# Patient Record
Sex: Male | Born: 1975 | Race: White | Hispanic: No | Marital: Single | Smoking: Never smoker
Health system: Southern US, Community
[De-identification: ages and names within clinical notes are randomized; demographics above are authoritative.]

---

## 2007-12-17 ENCOUNTER — Emergency Department: Payer: Self-pay | Admitting: Emergency Medicine

## 2008-05-06 ENCOUNTER — Emergency Department: Payer: Self-pay | Admitting: Emergency Medicine

## 2009-11-18 ENCOUNTER — Emergency Department: Payer: Self-pay | Admitting: Emergency Medicine

## 2010-03-17 ENCOUNTER — Ambulatory Visit: Payer: Self-pay

## 2011-09-13 IMAGING — NM NM BONE LIMITED
2 series · 5 of 5 positions shown · non-contrast
Comparison: none

REASON FOR EXAM: ATTN LEFT WRIST  wrist pain  eval for fracture
COMMENTS:

[Series 1000: bone pinhole · 2.40mm/px · 1 of 1 slices shown]
[im 1/1  full-range]
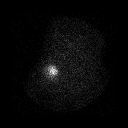

[Series 1000: bone statics · 2.40mm/px · 2 acquisitions, 4 frames shown]
[im 1/2]
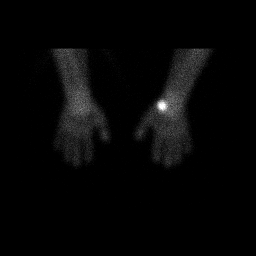
[im 1/2]
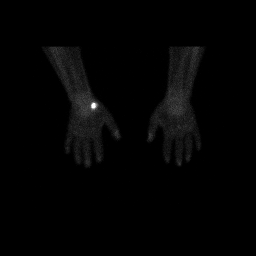
[im 2/2]
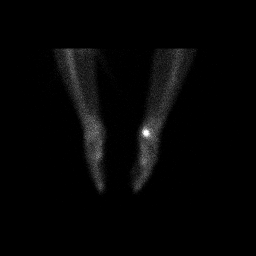
[im 2/2]
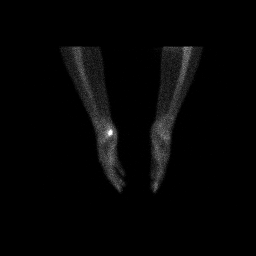

[5 of 5 positions shown; findings below may reference images not displayed]

PROCEDURE:     NM  - NM LIMITED BONE SCAN 3HR [DATE] [DATE]

RESULT:     Following intravenous administration of 22.7 mCi technetium-22m
MDP, bone scan of the wrists and hands was performed bilaterally. There is a
dense abnormal focal area of increased tracer activity in the region of the
left carpal navicular. If the patient has had recent trauma, fracture would
be the primary consideration as to etiology. The distribution of tracer
activity is otherwise normal in the hands and wrists.
IMPRESSION: Abnormal study secondary to the dense increase in tracer activity in the
region of the left navicular bone. Correlation with wrist radiographs is
recommended.

## 2015-05-22 ENCOUNTER — Encounter (HOSPITAL_COMMUNITY): Payer: Self-pay | Admitting: *Deleted

## 2015-05-22 ENCOUNTER — Emergency Department (HOSPITAL_COMMUNITY)
Admission: EM | Admit: 2015-05-22 | Discharge: 2015-05-22 | Disposition: A | Discharge status: Home / Self Care | Payer: BLUE CROSS/BLUE SHIELD | Source: Home / Self Care | Attending: Family Medicine | Admitting: Family Medicine

## 2015-05-22 DIAGNOSIS — H5712 Ocular pain, left eye: Secondary | ICD-10-CM | POA: Diagnosis not present

## 2015-05-22 MED ORDER — FLUORESCEIN SODIUM 1 MG OP STRP
ORAL_STRIP | OPHTHALMIC | Status: AC
  Filled 2015-05-22: qty 3

## 2015-05-22 NOTE — ED Notes (Signed)
Visual  Acuity  20  15  r  20/20 l   20/20 both

## 2015-05-22 NOTE — ED Provider Notes (Signed)
CSN: [id number]     Arrival date & time 05/22/15  1827 History   First MD Initiated Contact with Patient 05/22/15 1919     Chief Complaint  Patient presents with  . Eye Pain   (Consider location/radiation/quality/duration/timing/severity/associated sxs/prior Treatment) Patient is a 39 y.o. male presenting with eye pain. The history is provided by the patient.  Eye Pain This is a new problem. The current episode started 6 to 12 hours ago (awoke with soreness to left eye, no fb sens, no redness or drainage. no right eye sx, no contacts.). The problem has been gradually worsening.    History reviewed. No pertinent past medical history. History reviewed. No pertinent past surgical history. History reviewed. No pertinent family history. History  Substance Use Topics  . Smoking status: Never Smoker   . Smokeless tobacco: Not on file  . Alcohol Use: No    Review of Systems  Constitutional: Negative.   HENT: Negative.   Eyes: Positive for pain. Negative for photophobia, discharge, redness, itching and visual disturbance.    Allergies  Review of patient's allergies indicates no known allergies.  Home Medications   Prior to Admission medications   Not on File   BP 130/72 mmHg  Pulse 78  Temp(Src) 98.6 F (37 C) (Oral)  Resp 16  SpO2 100% Physical Exam  Constitutional: He is oriented to person, place, and time. He appears well-developed and well-nourished. No distress.  Eyes: Conjunctivae, EOM and lids are normal. Pupils are equal, round, and reactive to light. Lids are everted and swept, no foreign bodies found. Left eye exhibits no discharge, no exudate and no hordeolum. No foreign body present in the left eye. Left conjunctiva is not injected. Left conjunctiva has no hemorrhage. No scleral icterus.  Slit lamp exam:      The left eye shows no corneal abrasion, no corneal flare, no corneal ulcer, no foreign body, no hyphema, no fluorescein uptake and no anterior chamber bulge.   Neck: Normal range of motion. Neck supple.  Lymphadenopathy:    He has no cervical adenopathy.  Neurological: He is alert and oriented to person, place, and time.  Skin: Skin is warm and dry.  Nursing note and vitals reviewed.   ED Course  Procedures (including critical care time) Labs Review Labs Reviewed - No data to display  Imaging Review No results found.   MDM   1. Eye pain, left        Linna Hoff, MD 05/22/15 2005

## 2015-05-22 NOTE — Discharge Instructions (Signed)
Cool cloth soak to eye tonight and advil for soreness, call eye doctorif problem worsens

## 2015-05-22 NOTE — ED Notes (Signed)
Pt  Reports    Pain  l  Eye      States  He  Woke  Up  With  The   Pain  This  Am  denys  Any  Injury  denys  Any  Known   Foreign  Body   - pt   Sitting   Upright  On  The  examn tabkle  Speaking in  Complete  sentances

## 2016-02-05 ENCOUNTER — Encounter (HOSPITAL_COMMUNITY): Payer: Self-pay

## 2016-02-05 ENCOUNTER — Emergency Department (HOSPITAL_COMMUNITY)
Admission: EM | Admit: 2016-02-05 | Discharge: 2016-02-05 | Disposition: A | Discharge status: Home / Self Care | Payer: BLUE CROSS/BLUE SHIELD | Attending: Emergency Medicine | Admitting: Emergency Medicine

## 2016-02-05 DIAGNOSIS — Y998 Other external cause status: Secondary | ICD-10-CM | POA: Diagnosis not present

## 2016-02-05 DIAGNOSIS — Y9289 Other specified places as the place of occurrence of the external cause: Secondary | ICD-10-CM | POA: Insufficient documentation

## 2016-02-05 DIAGNOSIS — Y9389 Activity, other specified: Secondary | ICD-10-CM | POA: Insufficient documentation

## 2016-02-05 DIAGNOSIS — S39012A Strain of muscle, fascia and tendon of lower back, initial encounter: Secondary | ICD-10-CM | POA: Insufficient documentation

## 2016-02-05 DIAGNOSIS — X500XXA Overexertion from strenuous movement or load, initial encounter: Secondary | ICD-10-CM | POA: Insufficient documentation

## 2016-02-05 DIAGNOSIS — S3992XA Unspecified injury of lower back, initial encounter: Secondary | ICD-10-CM | POA: Diagnosis present

## 2016-02-05 MED ORDER — IBUPROFEN 800 MG PO TABS: 800.0000 mg | ORAL_TABLET | Freq: Three times a day (TID) | ORAL | Status: DC

## 2016-02-05 MED ORDER — METHOCARBAMOL 500 MG PO TABS: 500.0000 mg | ORAL_TABLET | Freq: Two times a day (BID) | ORAL | Status: DC

## 2016-02-05 NOTE — ED Notes (Signed)
Pt stable, ambulatory, states understanding of discharge instructions 

## 2016-02-05 NOTE — Discharge Instructions (Signed)

## 2016-02-05 NOTE — ED Notes (Signed)
Patient arrived by EMS with complains of back pain following lifting horse mat bent in the forward direction.  Pain is in the lower right side rated at a 10/10 originally but now is a 3.  Denies falling or losing consciousness.  Drove back to house and called EMS.  States he "does not want any narcotics"

## 2016-02-05 NOTE — ED Provider Notes (Signed)
CSN: [id number]     Arrival date & time 02/05/16  1827 History   First MD Initiated Contact with Patient 02/05/16 1849     Chief Complaint  Patient presents with  . Back Pain     (Consider location/radiation/quality/duration/timing/severity/associated sxs/prior Treatment) HPI   40 year old male brought here via EMS for evaluation of back pain. Patient states earlier in the day he was lifting a horse mats when he developed acute onset of right low back pain. He described pain as a sharp sensation, radiated across his back reports his right buttock and right groin that became progressively worsening with movement. He went home, try some ibuprofen but the pain intensified and patient's wife called EMS to bring him here. Since laying flat, he has noticed that the pain has improved from 10 out of 10 to 2 out of 10.No associated fever, chills, chest pain, difficulty breathing, dysuria, hematuria, focal numbness or weakness, or rash. 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 No history of active cancer. He admits that he has been performing a lot of heavy lifting within the past several days and felt that he may have over work himself. He does not want any narcotic pain medication. He denies any prior history of kidney stones or history of back problem. He denies any history of hernia. No report of bowel bladder incontinence or saddle anesthesia.  History reviewed. No pertinent past medical history. History reviewed. No pertinent past surgical history. History reviewed. No pertinent family history. Social History  Substance Use Topics  . Smoking status: Never Smoker   . Smokeless tobacco: None  . Alcohol Use: No    Review of Systems  Constitutional: Negative for fever.  Musculoskeletal: Positive for back pain.  Neurological: Negative for numbness.      Allergies  Review of patient's allergies indicates no known allergies.  Home  Medications   Prior to Admission medications   Not on File   BP 129/83 mmHg  Pulse 78  Temp(Src) 98.2 F (36.8 C) (Oral)  Resp 18  SpO2 97% Physical Exam  Constitutional: He appears well-developed and well-nourished. No distress.  HENT:  Head: Atraumatic.  Eyes: Conjunctivae are normal.  Neck: Neck supple.  Cardiovascular: Normal rate and regular rhythm.   Pulmonary/Chest: Effort normal and breath sounds normal.  Abdominal: Soft. There is no tenderness.  Genitourinary:  No CVA tenderness. No inguinal hernia noted.  Musculoskeletal: He exhibits tenderness (Mild tenderness noted to right paralumbar spinal muscle on palpation without any overlying skin changes. No significant midline spine tenderness, crepitus, or step-off. No evidence to suggest infection of the back. Negative straight leg raise.).  Neurological: He is alert.  Intact dorsalis pedis pulses to bilateral lower extremities. 5 out of 5 strength to both legs. Ambulate with normal gait. Patellar deep tendon reflex intact bilaterally.  Skin: No rash noted.  Psychiatric: He has a normal mood and affect.  Nursing note and vitals reviewed.   ED Course  Procedures (including critical care time)   MDM   Final diagnoses:  Low back strain, initial encounter    BP 124/68 mmHg  Pulse 67  Temp(Src) 98.2 F (36.8 C) (Oral)  Resp 18  SpO2 97%   7:14 PM Patient here with low back pain after heavy lifting likely low back strain. No appreciable radiculopathy noted. No red flags. He is able to ambulate without difficulty. Low suspicion for kidney stones. Remote history of IV  drug use but no signs to suggest epidural abscess. I offer to check urine and give IV Toradol but patient states that his pain is minimal at this time and he feels comfortable going home. Return precaution discussed. Patient is stable for discharge. Rice therapy discussed, recommend avoid heavy lifting for the next week  Fayrene Helper, PA-C 02/05/16  1920  Gwyneth Sprout, MD 02/05/16 2132

## 2017-05-01 DIAGNOSIS — M79671 Pain in right foot: Secondary | ICD-10-CM | POA: Diagnosis not present

## 2018-06-21 DIAGNOSIS — Z23 Encounter for immunization: Secondary | ICD-10-CM | POA: Diagnosis not present

## 2018-06-21 DIAGNOSIS — Z0000 Encounter for general adult medical examination without abnormal findings: Secondary | ICD-10-CM | POA: Diagnosis not present

## 2019-06-27 DIAGNOSIS — Z0000 Encounter for general adult medical examination without abnormal findings: Secondary | ICD-10-CM | POA: Diagnosis not present

## 2020-01-10 ENCOUNTER — Ambulatory Visit: Payer: BLUE CROSS/BLUE SHIELD

## 2020-03-13 ENCOUNTER — Ambulatory Visit: Payer: BLUE CROSS/BLUE SHIELD

## 2020-04-14 ENCOUNTER — Emergency Department (HOSPITAL_COMMUNITY)
Admission: EM | Admit: 2020-04-14 | Discharge: 2020-04-14 | Disposition: A | Discharge status: Home / Self Care | Payer: BC Managed Care – PPO | Attending: Emergency Medicine | Admitting: Emergency Medicine

## 2020-04-14 ENCOUNTER — Encounter (HOSPITAL_COMMUNITY): Payer: Self-pay | Admitting: Emergency Medicine

## 2020-04-14 ENCOUNTER — Emergency Department (HOSPITAL_COMMUNITY): Payer: BC Managed Care – PPO

## 2020-04-14 DIAGNOSIS — Z79899 Other long term (current) drug therapy: Secondary | ICD-10-CM | POA: Insufficient documentation

## 2020-04-14 DIAGNOSIS — R109 Unspecified abdominal pain: Secondary | ICD-10-CM | POA: Diagnosis not present

## 2020-04-14 DIAGNOSIS — N200 Calculus of kidney: Secondary | ICD-10-CM | POA: Diagnosis not present

## 2020-04-14 DIAGNOSIS — N132 Hydronephrosis with renal and ureteral calculous obstruction: Secondary | ICD-10-CM | POA: Diagnosis not present

## 2020-04-14 LAB — I-STAT CHEM 8, ED
BUN: 14 mg/dL (ref 6–20)
Calcium, Ion: 1.21 mmol/L (ref 1.15–1.40)
Chloride: 103 mmol/L (ref 98–111)
Creatinine, Ser: 1 mg/dL (ref 0.61–1.24)
Glucose, Bld: 111 mg/dL — ABNORMAL HIGH (ref 70–99)
HCT: 48 % (ref 39.0–52.0)
Hemoglobin: 16.3 g/dL (ref 13.0–17.0)
Potassium: 4.6 mmol/L (ref 3.5–5.1)
Sodium: 139 mmol/L (ref 135–145)
TCO2: 28 mmol/L (ref 22–32)

## 2020-04-14 LAB — URINALYSIS, ROUTINE W REFLEX MICROSCOPIC
Bacteria, UA: NONE SEEN
Bilirubin Urine: NEGATIVE
Glucose, UA: NEGATIVE mg/dL
Ketones, ur: NEGATIVE mg/dL
Leukocytes,Ua: NEGATIVE
Nitrite: NEGATIVE
Protein, ur: NEGATIVE mg/dL
Specific Gravity, Urine: 1.02 (ref 1.005–1.030)
pH: 5 (ref 5.0–8.0)

## 2020-04-14 MED ORDER — KETOROLAC TROMETHAMINE 30 MG/ML IJ SOLN
30.0000 mg | Freq: Once | INTRAMUSCULAR | Status: AC
  Administered 2020-04-14: 30 mg via INTRAMUSCULAR
  Filled 2020-04-14: qty 1

## 2020-04-14 MED ORDER — MORPHINE SULFATE (PF) 4 MG/ML IV SOLN: 4.0000 mg | Freq: Once | INTRAVENOUS | Status: DC

## 2020-04-14 MED ORDER — IBUPROFEN 800 MG PO TABS: 800.0000 mg | ORAL_TABLET | Freq: Four times a day (QID) | ORAL | 0 refills | Status: AC | PRN

## 2020-04-14 NOTE — Discharge Instructions (Addendum)
You have been diagnosed with having a 2 mm kidney stone on the right side.  You will likely able to pass the stone in the next several days.  Use the urine strainer when urinating to capture the stone.  Follow-up with urologist for further care.  Take ibuprofen as needed for pain.

## 2020-04-14 NOTE — ED Provider Notes (Signed)
[facility] DEPART[facility] Provider Note   CSN: [id number] Arrival date & time: 04/14/20  0848     History Chief Complaint  Patient presents with  . Abdominal Pain  . Nausea    Darius Johnson is a 44 y.o. male.  The history is provided by the patient. No language interpreter was used.  Abdominal Pain    44 year old male presenting complaining of flank pain.  Patient report he developed pain to his right flank last night.  He is sleeping.  Pain now radiates towards his right testicle.  Described pain service stabbing sensation, persistent, nothing seems make it better or worse.  Work-up as well.  He endorsed nausea and attributed to the pain.  He endorsed difficulty urinating.  Does not have any fever, abdominal pain, or rash.  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  No known history of kidney stone.   History reviewed. No pertinent past medical history.  There are no problems to display for this patient.   History reviewed. No pertinent surgical history.     No family history on file.  Social History   Tobacco Use  . Smoking status: Never Smoker  . Smokeless tobacco: Never Used  Substance Use Topics  . Alcohol use: No  . Drug use: Not on file    Home Medications Prior to Admission medications   Medication Sig Start Date End Date Taking? Authorizing Provider  ibuprofen (ADVIL,MOTRIN) 800 MG tablet Take 1 tablet (800 mg total) by mouth 3 (three) times daily. 02/05/16   Domenic Moras, PA-C  methocarbamol (ROBAXIN) 500 MG tablet Take 1 tablet (500 mg total) by mouth 2 (two) times daily. 02/05/16   Domenic Moras, PA-C    Allergies    Patient has no known allergies.  Review of Systems   Review of Systems  Gastrointestinal: Positive for abdominal pain.  All other systems reviewed and are  negative.   Physical Exam Updated Vital Signs BP (!) 161/103 (BP Location: Right Arm)   Pulse 69   Temp 97.7 F (36.5 C) (Oral)   Resp 20   Ht 5\' 6"  (1.676 m)   Wt 70.3 kg   SpO2 100%   BMI 25.02 kg/m   Physical Exam Vitals and nursing note reviewed. Exam conducted with a chaperone present.  Constitutional:      Appearance: He is well-developed.     Comments: Patient appears to be in obvious discomfort, writhing around in bed.  HENT:     Head: Atraumatic.  Eyes:     Conjunctiva/sclera: Conjunctivae normal.  Cardiovascular:     Rate and Rhythm: Normal rate and regular rhythm.  Pulmonary:     Effort: Pulmonary effort is normal.     Breath sounds: Normal breath sounds.  Abdominal:     General: Abdomen is flat.     Palpations: Abdomen is soft.     Tenderness: There is no abdominal tenderness. There is right CVA tenderness.     Hernia: No hernia is present.  Genitourinary:    Testes:        Right: Tenderness (Tenderness right testicle.  ) present. Swelling not present.        Left: Tenderness or swelling not present.  Musculoskeletal:     Cervical back: Neck supple.  Skin:    Findings: No rash.  Neurological:  Mental Status: He is alert.     ED Results / Procedures / Treatments   Labs (all labs ordered are listed, but only abnormal results are displayed) Labs Reviewed  URINALYSIS, ROUTINE W REFLEX MICROSCOPIC - Abnormal; Notable for the following components:      Result Value   Hgb urine dipstick SMALL (*)    All other components within normal limits  I-STAT CHEM 8, ED - Abnormal; Notable for the following components:   Glucose, Bld 111 (*)    All other components within normal limits    EKG None  Radiology CT Renal Stone Study  Result Date: 04/14/2020 CLINICAL DATA:  Right flank and back pain. EXAM: CT ABDOMEN AND PELVIS WITHOUT CONTRAST TECHNIQUE: Multidetector CT imaging of the abdomen and pelvis was performed following the standard protocol without  IV contrast. COMPARISON:  None. FINDINGS: Lower chest: No acute abnormality. Evaluation of the abdominal viscera somewhat limited by the lack of IV contrast. Hepatobiliary: No focal liver abnormality is seen. No gallstones, gallbladder wall thickening, or biliary dilatation. Pancreas: Unremarkable. No surrounding inflammatory changes. Spleen: Normal in size without focal abnormality. Adrenals/Urinary Tract: Adrenal glands are unremarkable. There is mild right hydronephrosis and ureterectasis secondary to a 2 mm stone at the right ureterovesical junction (series 5, image 57). There is a punctate calculus in the right kidney. No left renal calculi. No left hydronephrosis. The urinary bladder is unremarkable. Stomach/Bowel: Stomach is within normal limits. Appendix appears normal. No evidence of bowel wall thickening, distention, or inflammatory changes. Vascular/Lymphatic: No significant vascular findings are present. No enlarged abdominal or pelvic lymph nodes. Reproductive: Prostate is unremarkable. Other: No abdominal wall hernia or abnormality. No abdominopelvic ascites. Musculoskeletal: No acute or significant osseous findings. IMPRESSION: Mild right hydronephrosis and ureterectasis secondary to a 2 mm stone at the right ureterovesical junction. Electronically Signed   By: Emmaline Kluver M.D.   On: 04/14/2020 12:42    Procedures Procedures (including critical care time)  Medications Ordered in ED Medications  ketorolac (TORADOL) 30 MG/ML injection 30 mg (30 mg Intramuscular Given 04/14/20 1014)    ED Course  I have reviewed the triage vital signs and the nursing notes.  Pertinent labs & imaging results that were available during my care of the patient were reviewed by me and considered in my medical decision making (see chart for details).    MDM Rules/Calculators/A&P                          BP (!) 161/103 (BP Location: Right Arm)   Pulse 69   Temp 97.7 F (36.5 C) (Oral)   Resp 20    Ht 5\' 6"  (1.676 m)   Wt 70.3 kg   SpO2 100%   BMI 25.02 kg/m   Final Clinical Impression(s) / ED Diagnoses Final diagnoses:  Kidney stone on right side    Rx / DC Orders ED Discharge Orders         Ordered    ibuprofen (ADVIL) 800 MG tablet  Every 6 hours PRN     Discontinue  Reprint     04/14/20 1350         10:23 AM Patient here with acute onset of right flank pain that radiates to his right testicle.  Testicle kidney stone.  Less likely testicular torsion.  Ordered initiated, patient requesting for nonopioid medication, Toradol given.  10:52 AM UA shows trace of hemoglobin on urine dipstick but no evidence of infection.  Patient has normal renal function.  Patient received Toradol but states it provide minimal relief.  1:47 PM Patient report much improvement.  CT scan demonstrate mild right hydronephrosis secondary to a 2 mm stone at the right UVJ.  Discussed plan with patient.  At this time he is stable for discharge.  Referral to urology given.  Return precaution discussed.   Fayrene Helper, PA-C 04/14/20 1351    Gwyneth Sprout, MD 04/14/20 2140

## 2020-04-14 NOTE — ED Triage Notes (Signed)
Pt. Stated, it started yesterday feeling like I had to pee and about 25 min ago started having pain in the back area, sharpe Pt. Walking, pacing due to pain

## 2021-02-01 DIAGNOSIS — Z20822 Contact with and (suspected) exposure to covid-19: Secondary | ICD-10-CM | POA: Diagnosis not present

## 2021-02-04 DIAGNOSIS — Z20822 Contact with and (suspected) exposure to covid-19: Secondary | ICD-10-CM | POA: Diagnosis not present

## 2021-03-05 DIAGNOSIS — R001 Bradycardia, unspecified: Secondary | ICD-10-CM | POA: Diagnosis not present

## 2021-03-05 DIAGNOSIS — I10 Essential (primary) hypertension: Secondary | ICD-10-CM | POA: Diagnosis not present

## 2021-03-06 DIAGNOSIS — R001 Bradycardia, unspecified: Secondary | ICD-10-CM | POA: Diagnosis not present

## 2021-06-17 DIAGNOSIS — M9901 Segmental and somatic dysfunction of cervical region: Secondary | ICD-10-CM | POA: Diagnosis not present

## 2021-06-17 DIAGNOSIS — M9903 Segmental and somatic dysfunction of lumbar region: Secondary | ICD-10-CM | POA: Diagnosis not present

## 2021-06-17 DIAGNOSIS — M5413 Radiculopathy, cervicothoracic region: Secondary | ICD-10-CM | POA: Diagnosis not present

## 2021-06-17 DIAGNOSIS — M5442 Lumbago with sciatica, left side: Secondary | ICD-10-CM | POA: Diagnosis not present

## 2021-06-18 DIAGNOSIS — M9903 Segmental and somatic dysfunction of lumbar region: Secondary | ICD-10-CM | POA: Diagnosis not present

## 2021-06-18 DIAGNOSIS — M9901 Segmental and somatic dysfunction of cervical region: Secondary | ICD-10-CM | POA: Diagnosis not present

## 2021-06-18 DIAGNOSIS — M5442 Lumbago with sciatica, left side: Secondary | ICD-10-CM | POA: Diagnosis not present

## 2021-06-18 DIAGNOSIS — M5413 Radiculopathy, cervicothoracic region: Secondary | ICD-10-CM | POA: Diagnosis not present

## 2021-06-21 DIAGNOSIS — M9901 Segmental and somatic dysfunction of cervical region: Secondary | ICD-10-CM | POA: Diagnosis not present

## 2021-06-21 DIAGNOSIS — M5442 Lumbago with sciatica, left side: Secondary | ICD-10-CM | POA: Diagnosis not present

## 2021-06-21 DIAGNOSIS — M5413 Radiculopathy, cervicothoracic region: Secondary | ICD-10-CM | POA: Diagnosis not present

## 2021-06-21 DIAGNOSIS — M9903 Segmental and somatic dysfunction of lumbar region: Secondary | ICD-10-CM | POA: Diagnosis not present

## 2021-06-24 DIAGNOSIS — M5442 Lumbago with sciatica, left side: Secondary | ICD-10-CM | POA: Diagnosis not present

## 2021-06-24 DIAGNOSIS — M9903 Segmental and somatic dysfunction of lumbar region: Secondary | ICD-10-CM | POA: Diagnosis not present

## 2021-06-24 DIAGNOSIS — M5413 Radiculopathy, cervicothoracic region: Secondary | ICD-10-CM | POA: Diagnosis not present

## 2021-06-24 DIAGNOSIS — M9901 Segmental and somatic dysfunction of cervical region: Secondary | ICD-10-CM | POA: Diagnosis not present

## 2021-06-29 DIAGNOSIS — M9903 Segmental and somatic dysfunction of lumbar region: Secondary | ICD-10-CM | POA: Diagnosis not present

## 2021-06-29 DIAGNOSIS — M9901 Segmental and somatic dysfunction of cervical region: Secondary | ICD-10-CM | POA: Diagnosis not present

## 2021-06-29 DIAGNOSIS — M5413 Radiculopathy, cervicothoracic region: Secondary | ICD-10-CM | POA: Diagnosis not present

## 2021-06-29 DIAGNOSIS — M5442 Lumbago with sciatica, left side: Secondary | ICD-10-CM | POA: Diagnosis not present

## 2021-07-01 DIAGNOSIS — M9903 Segmental and somatic dysfunction of lumbar region: Secondary | ICD-10-CM | POA: Diagnosis not present

## 2021-07-01 DIAGNOSIS — M5442 Lumbago with sciatica, left side: Secondary | ICD-10-CM | POA: Diagnosis not present

## 2021-07-01 DIAGNOSIS — M5413 Radiculopathy, cervicothoracic region: Secondary | ICD-10-CM | POA: Diagnosis not present

## 2021-07-01 DIAGNOSIS — M9901 Segmental and somatic dysfunction of cervical region: Secondary | ICD-10-CM | POA: Diagnosis not present

## 2021-07-05 DIAGNOSIS — M5413 Radiculopathy, cervicothoracic region: Secondary | ICD-10-CM | POA: Diagnosis not present

## 2021-07-05 DIAGNOSIS — M9901 Segmental and somatic dysfunction of cervical region: Secondary | ICD-10-CM | POA: Diagnosis not present

## 2021-07-05 DIAGNOSIS — M9903 Segmental and somatic dysfunction of lumbar region: Secondary | ICD-10-CM | POA: Diagnosis not present

## 2021-07-05 DIAGNOSIS — M5442 Lumbago with sciatica, left side: Secondary | ICD-10-CM | POA: Diagnosis not present

## 2021-07-07 DIAGNOSIS — M5442 Lumbago with sciatica, left side: Secondary | ICD-10-CM | POA: Diagnosis not present

## 2021-07-07 DIAGNOSIS — M9901 Segmental and somatic dysfunction of cervical region: Secondary | ICD-10-CM | POA: Diagnosis not present

## 2021-07-07 DIAGNOSIS — M9903 Segmental and somatic dysfunction of lumbar region: Secondary | ICD-10-CM | POA: Diagnosis not present

## 2021-07-07 DIAGNOSIS — M5413 Radiculopathy, cervicothoracic region: Secondary | ICD-10-CM | POA: Diagnosis not present

## 2021-07-08 DIAGNOSIS — M9903 Segmental and somatic dysfunction of lumbar region: Secondary | ICD-10-CM | POA: Diagnosis not present

## 2021-07-08 DIAGNOSIS — M9901 Segmental and somatic dysfunction of cervical region: Secondary | ICD-10-CM | POA: Diagnosis not present

## 2021-07-08 DIAGNOSIS — M5413 Radiculopathy, cervicothoracic region: Secondary | ICD-10-CM | POA: Diagnosis not present

## 2021-07-08 DIAGNOSIS — M5442 Lumbago with sciatica, left side: Secondary | ICD-10-CM | POA: Diagnosis not present

## 2021-07-12 DIAGNOSIS — M5413 Radiculopathy, cervicothoracic region: Secondary | ICD-10-CM | POA: Diagnosis not present

## 2021-07-12 DIAGNOSIS — M9903 Segmental and somatic dysfunction of lumbar region: Secondary | ICD-10-CM | POA: Diagnosis not present

## 2021-07-12 DIAGNOSIS — M5442 Lumbago with sciatica, left side: Secondary | ICD-10-CM | POA: Diagnosis not present

## 2021-07-12 DIAGNOSIS — M9901 Segmental and somatic dysfunction of cervical region: Secondary | ICD-10-CM | POA: Diagnosis not present

## 2021-07-19 DIAGNOSIS — M5413 Radiculopathy, cervicothoracic region: Secondary | ICD-10-CM | POA: Diagnosis not present

## 2021-07-19 DIAGNOSIS — M5442 Lumbago with sciatica, left side: Secondary | ICD-10-CM | POA: Diagnosis not present

## 2021-07-19 DIAGNOSIS — M9903 Segmental and somatic dysfunction of lumbar region: Secondary | ICD-10-CM | POA: Diagnosis not present

## 2021-07-19 DIAGNOSIS — M9901 Segmental and somatic dysfunction of cervical region: Secondary | ICD-10-CM | POA: Diagnosis not present

## 2021-07-21 DIAGNOSIS — M5413 Radiculopathy, cervicothoracic region: Secondary | ICD-10-CM | POA: Diagnosis not present

## 2021-07-21 DIAGNOSIS — M9903 Segmental and somatic dysfunction of lumbar region: Secondary | ICD-10-CM | POA: Diagnosis not present

## 2021-07-21 DIAGNOSIS — M5442 Lumbago with sciatica, left side: Secondary | ICD-10-CM | POA: Diagnosis not present

## 2021-07-21 DIAGNOSIS — M9901 Segmental and somatic dysfunction of cervical region: Secondary | ICD-10-CM | POA: Diagnosis not present

## 2021-07-22 DIAGNOSIS — M9903 Segmental and somatic dysfunction of lumbar region: Secondary | ICD-10-CM | POA: Diagnosis not present

## 2021-07-22 DIAGNOSIS — M5442 Lumbago with sciatica, left side: Secondary | ICD-10-CM | POA: Diagnosis not present

## 2021-07-22 DIAGNOSIS — M9901 Segmental and somatic dysfunction of cervical region: Secondary | ICD-10-CM | POA: Diagnosis not present

## 2021-07-22 DIAGNOSIS — M5413 Radiculopathy, cervicothoracic region: Secondary | ICD-10-CM | POA: Diagnosis not present

## 2021-10-11 IMAGING — CT CT RENAL STONE PROTOCOL
2 of 4 series · 16 of 46 positions shown, 18 images · non-contrast
Comparison: None.

CLINICAL DATA: Right flank and back pain.

EXAM:
CT ABDOMEN AND PELVIS WITHOUT CONTRAST
TECHNIQUE: Multidetector CT imaging of the abdomen and pelvis was performed
following the standard protocol without IV contrast.

[Series 2: stone study 5.0 i30f 2 · axial · 0.70mm/px · z∈[+810,+1240]mm · 13 of 94 slices shown, 15 images]
[im 4/94  soft-tissue]
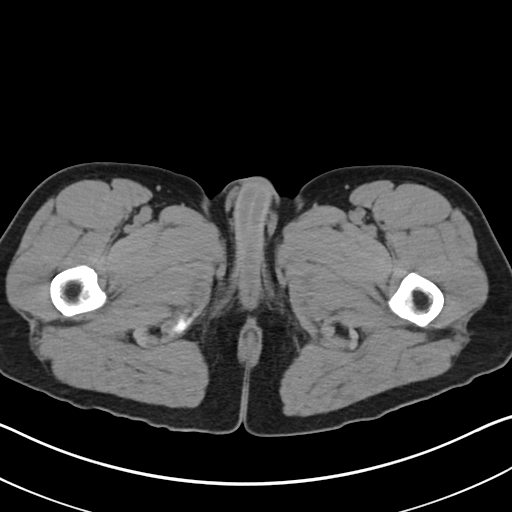
[im 4/94  bone]
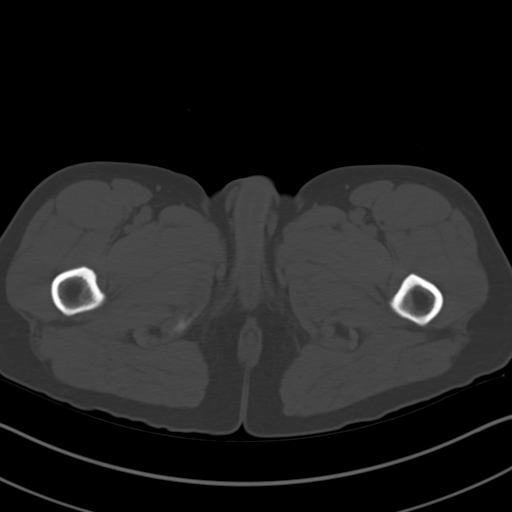
[im 12/94  soft-tissue]
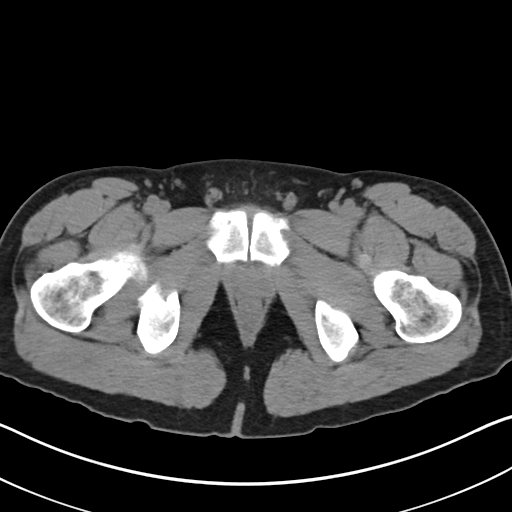
[im 19/94  soft-tissue]
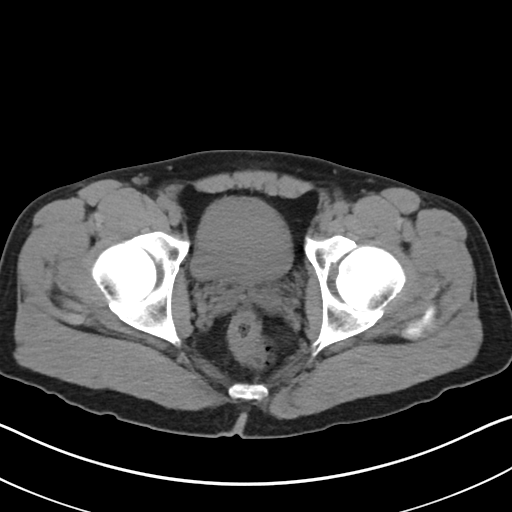
[im 27/94  soft-tissue]
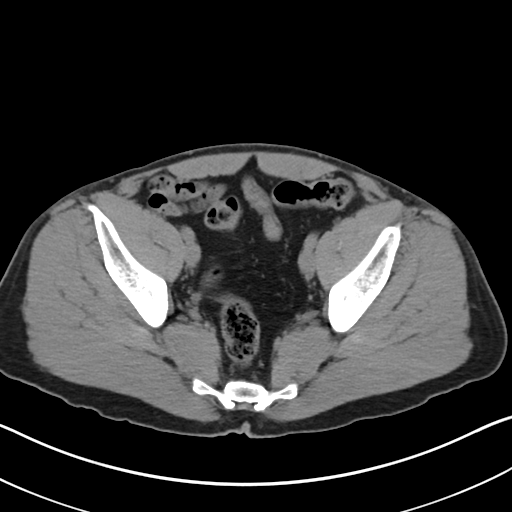
[im 34/94  soft-tissue]
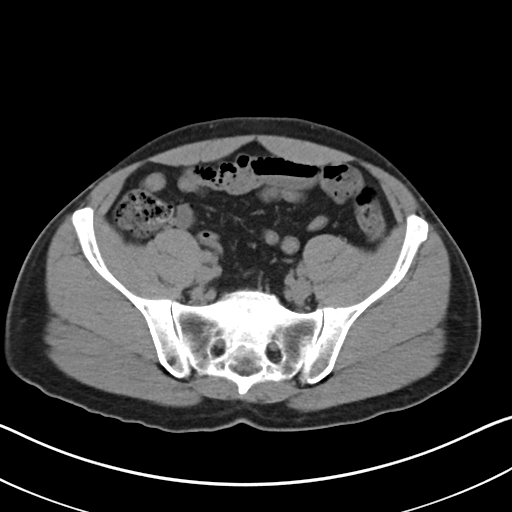
[im 41/94  soft-tissue]
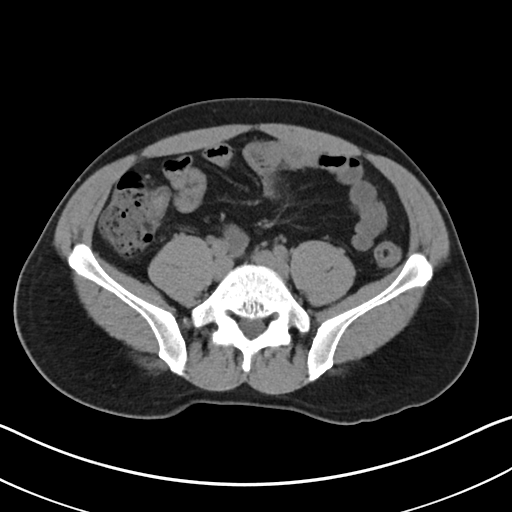
[im 49/94  soft-tissue]
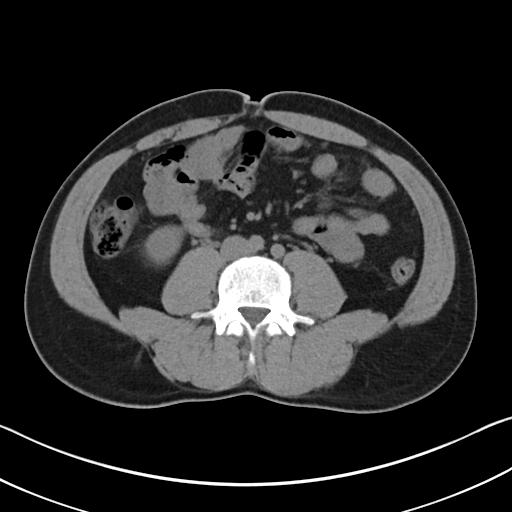
[im 53/94  soft-tissue]
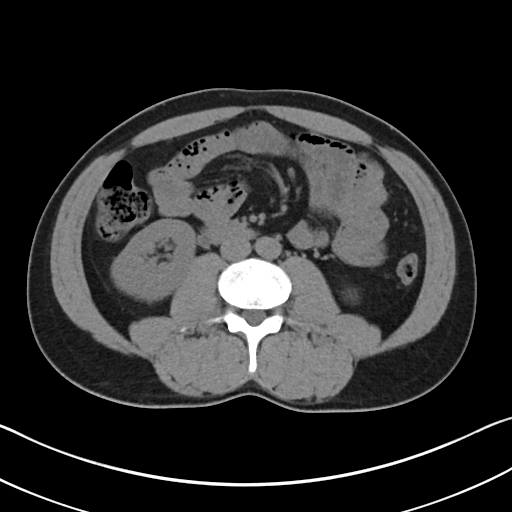
[im 60/94  soft-tissue]
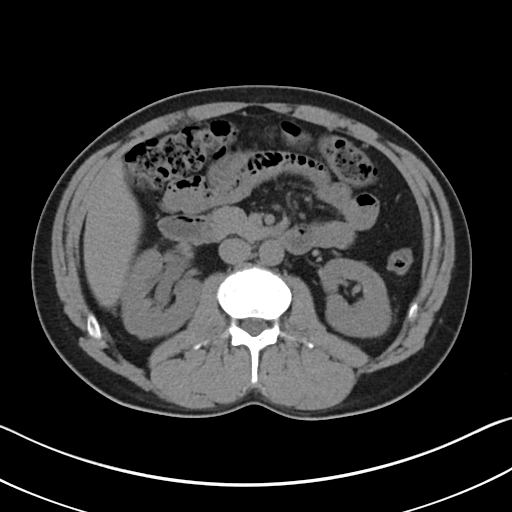
[im 60/94  bone]
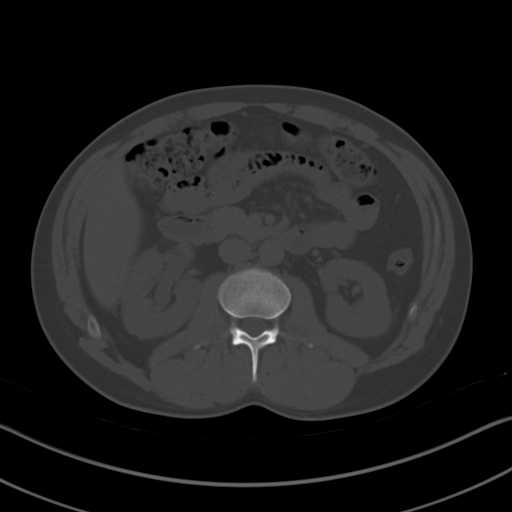
[im 67/94  soft-tissue]
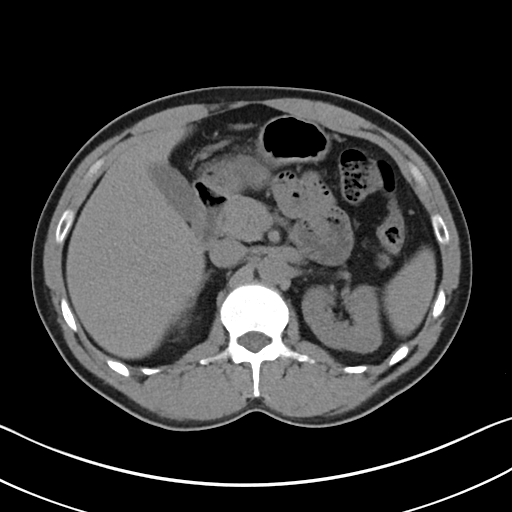
[im 75/94  soft-tissue]
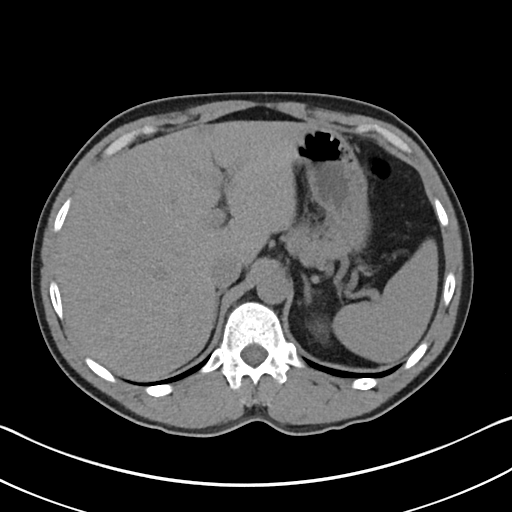
[im 82/94  soft-tissue]
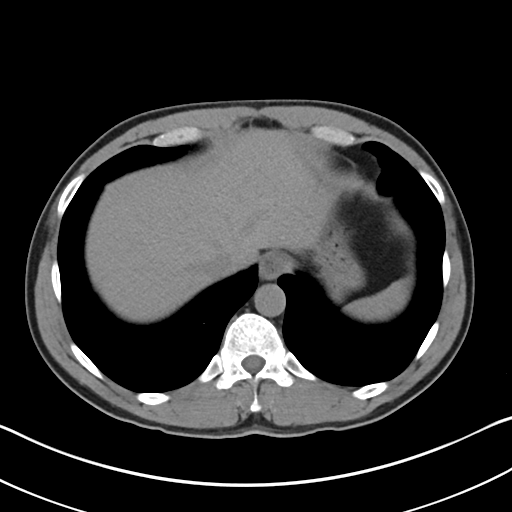
[im 90/94  soft-tissue]
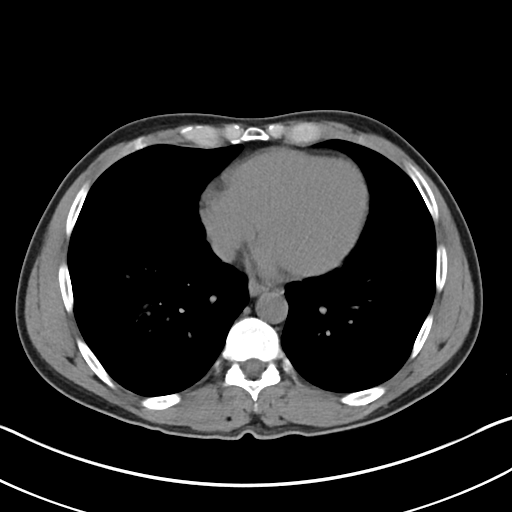

[Series 5: coronal soft tissue · coronal · 0.69mm/px · 3 of 101 slices shown]
[im 34/101  soft-tissue]
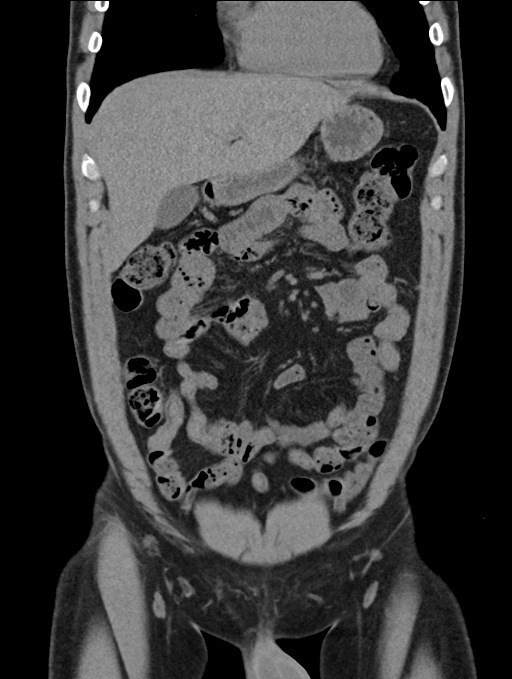
[im 45/101  soft-tissue]
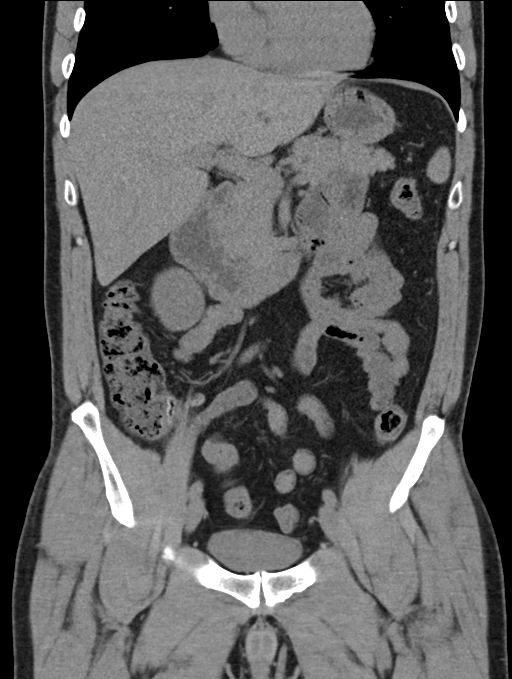
[im 56/101  soft-tissue]
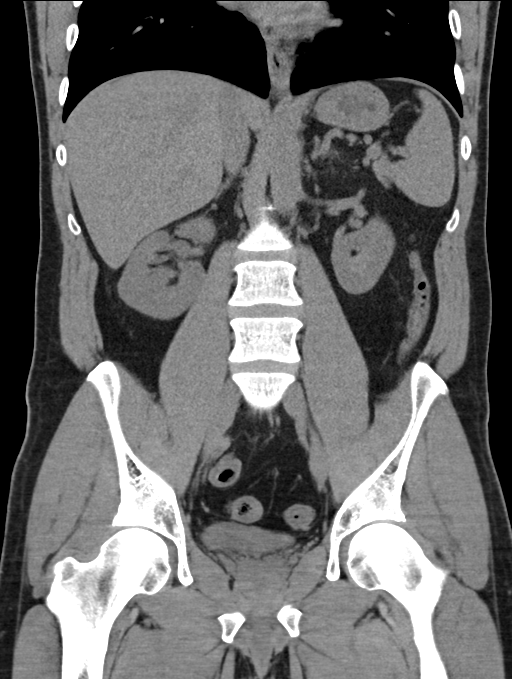

[16 of 46 positions shown; findings below may reference images not displayed]

FINDINGS: Lower chest: No acute abnormality.

Evaluation of the abdominal viscera somewhat limited by the lack of
IV contrast.

Hepatobiliary: No focal liver abnormality is seen. No gallstones,
gallbladder wall thickening, or biliary dilatation.

Pancreas: Unremarkable. No surrounding inflammatory changes.

Spleen: Normal in size without focal abnormality.

Adrenals/Urinary Tract: Adrenal glands are unremarkable. There is
mild right hydronephrosis and ureterectasis secondary to a 2 mm
stone at the right ureterovesical junction (series 5, image 57).
There is a punctate calculus in the right kidney. No left renal
calculi. No left hydronephrosis. The urinary bladder is
unremarkable.

Stomach/Bowel: Stomach is within normal limits. Appendix appears
normal. No evidence of bowel wall thickening, distention, or
inflammatory changes.

Vascular/Lymphatic: No significant vascular findings are present. No
enlarged abdominal or pelvic lymph nodes.

Reproductive: Prostate is unremarkable.

Other: No abdominal wall hernia or abnormality. No abdominopelvic
ascites.

Musculoskeletal: No acute or significant osseous findings.
IMPRESSION: Mild right hydronephrosis and ureterectasis secondary to a 2 mm
stone at the right ureterovesical junction.

## 2023-05-24 ENCOUNTER — Emergency Department (HOSPITAL_COMMUNITY)
Admission: EM | Admit: 2023-05-24 | Discharge: 2023-05-24 | Discharge status: Against Medical Advice | Payer: BC Managed Care – PPO | Attending: Emergency Medicine | Admitting: Emergency Medicine

## 2023-05-24 ENCOUNTER — Encounter (HOSPITAL_COMMUNITY): Payer: Self-pay

## 2023-05-24 ENCOUNTER — Other Ambulatory Visit: Payer: Self-pay

## 2023-05-24 DIAGNOSIS — Z5321 Procedure and treatment not carried out due to patient leaving prior to being seen by health care provider: Secondary | ICD-10-CM | POA: Diagnosis not present

## 2023-05-24 DIAGNOSIS — R252 Cramp and spasm: Secondary | ICD-10-CM | POA: Insufficient documentation

## 2023-05-24 DIAGNOSIS — R509 Fever, unspecified: Secondary | ICD-10-CM | POA: Diagnosis not present

## 2023-05-24 DIAGNOSIS — R059 Cough, unspecified: Secondary | ICD-10-CM | POA: Diagnosis not present

## 2023-05-24 DIAGNOSIS — S80861A Insect bite (nonvenomous), right lower leg, initial encounter: Secondary | ICD-10-CM | POA: Diagnosis present

## 2023-05-24 DIAGNOSIS — W57XXXA Bitten or stung by nonvenomous insect and other nonvenomous arthropods, initial encounter: Secondary | ICD-10-CM | POA: Insufficient documentation

## 2023-05-24 LAB — CBC WITH DIFFERENTIAL/PLATELET
Abs Immature Granulocytes: 0.01 10*3/uL (ref 0.00–0.07)
Basophils Absolute: 0 10*3/uL (ref 0.0–0.1)
Basophils Relative: 1 %
Eosinophils Absolute: 0 10*3/uL (ref 0.0–0.5)
Eosinophils Relative: 1 %
HCT: 44.2 % (ref 39.0–52.0)
Hemoglobin: 15.5 g/dL (ref 13.0–17.0)
Immature Granulocytes: 0 %
Lymphocytes Relative: 29 %
Lymphs Abs: 1.3 10*3/uL (ref 0.7–4.0)
MCH: 30 pg (ref 26.0–34.0)
MCHC: 35.1 g/dL (ref 30.0–36.0)
MCV: 85.5 fL (ref 80.0–100.0)
Monocytes Absolute: 0.5 10*3/uL (ref 0.1–1.0)
Monocytes Relative: 11 %
Neutro Abs: 2.6 10*3/uL (ref 1.7–7.7)
Neutrophils Relative %: 58 %
Platelets: 190 10*3/uL (ref 150–400)
RBC: 5.17 MIL/uL (ref 4.22–5.81)
RDW: 12.5 % (ref 11.5–15.5)
WBC: 4.4 10*3/uL (ref 4.0–10.5)
nRBC: 0 % (ref 0.0–0.2)

## 2023-05-24 LAB — URINALYSIS, ROUTINE W REFLEX MICROSCOPIC
Bacteria, UA: NONE SEEN
Bilirubin Urine: NEGATIVE
Glucose, UA: NEGATIVE mg/dL
Ketones, ur: NEGATIVE mg/dL
Leukocytes,Ua: NEGATIVE
Nitrite: NEGATIVE
Protein, ur: NEGATIVE mg/dL
Specific Gravity, Urine: 1.018 (ref 1.005–1.030)
pH: 7 (ref 5.0–8.0)

## 2023-05-24 LAB — COMPREHENSIVE METABOLIC PANEL
ALT: 49 U/L — ABNORMAL HIGH (ref 0–44)
AST: 38 U/L (ref 15–41)
Albumin: 4.4 g/dL (ref 3.5–5.0)
Alkaline Phosphatase: 78 U/L (ref 38–126)
Anion gap: 10 (ref 5–15)
BUN: 16 mg/dL (ref 6–20)
CO2: 24 mmol/L (ref 22–32)
Calcium: 9.5 mg/dL (ref 8.9–10.3)
Chloride: 99 mmol/L (ref 98–111)
Creatinine, Ser: 0.97 mg/dL (ref 0.61–1.24)
GFR, Estimated: >60 mL/min (ref >60)
Glucose, Bld: 112 mg/dL — ABNORMAL HIGH (ref 70–99)
Potassium: 4 mmol/L (ref 3.5–5.1)
Sodium: 133 mmol/L — ABNORMAL LOW (ref 135–145)
Total Bilirubin: 0.7 mg/dL (ref 0.3–1.2)
Total Protein: 7.1 g/dL (ref 6.5–8.1)

## 2023-05-24 LAB — I-STAT CG4 LACTIC ACID, ED: Lactic Acid, Venous: 0.9 mmol/L (ref 0.5–1.9)

## 2023-05-24 MED ORDER — ACETAMINOPHEN 325 MG PO TABS: 650.0000 mg | ORAL_TABLET | Freq: Once | ORAL | Status: DC | PRN

## 2023-05-24 NOTE — ED Notes (Signed)
Pt notified writer that he will be leaving AMA. No acute distress noted. Pt A&Ox4.

## 2023-05-24 NOTE — ED Triage Notes (Signed)
Insect bite to right lower leg 5 days ago after working in [employer]. Initially didn't think much of it but later began having body cramps, fever and a nonproductive cough.   Was seen at [facility] and given rx for doxycycline but encouraged to ED if symptoms worsened.   Took 600mg  ibuprofen around 6PM.

## 2023-05-24 NOTE — ED Notes (Signed)
Wants to wait on CXR until speaking with ED providers.   Also wants to hold off on tylenol until checking vehicle to see if they have any.
